# Patient Record
Sex: Male | Born: 1960 | Race: White | Hispanic: No | Marital: Married | Smoking: Never smoker
Health system: Southern US, Community
[De-identification: ages and names within clinical notes are randomized; demographics above are authoritative.]

## PROBLEM LIST (undated history)

## (undated) DIAGNOSIS — E782 Mixed hyperlipidemia: Secondary | ICD-10-CM

## (undated) DIAGNOSIS — N200 Calculus of kidney: Secondary | ICD-10-CM

## (undated) DIAGNOSIS — E119 Type 2 diabetes mellitus without complications: Secondary | ICD-10-CM

## (undated) HISTORY — DX: Calculus of kidney: N20.0

## (undated) HISTORY — DX: Mixed hyperlipidemia: E78.2

## (undated) HISTORY — PX: HERNIA REPAIR: SHX51

## (undated) HISTORY — DX: Type 2 diabetes mellitus without complications: E11.9

---

## 2019-06-13 ENCOUNTER — Encounter (HOSPITAL_COMMUNITY): Payer: Self-pay | Admitting: Emergency Medicine

## 2019-06-13 ENCOUNTER — Emergency Department (HOSPITAL_COMMUNITY): Payer: 59

## 2019-06-13 ENCOUNTER — Emergency Department (HOSPITAL_COMMUNITY)
Admission: EM | Admit: 2019-06-13 | Discharge: 2019-06-14 | Disposition: A | Discharge status: Home / Self Care | Payer: 59 | Attending: Emergency Medicine | Admitting: Emergency Medicine

## 2019-06-13 ENCOUNTER — Other Ambulatory Visit: Payer: Self-pay

## 2019-06-13 DIAGNOSIS — R197 Diarrhea, unspecified: Secondary | ICD-10-CM | POA: Insufficient documentation

## 2019-06-13 DIAGNOSIS — R509 Fever, unspecified: Secondary | ICD-10-CM | POA: Insufficient documentation

## 2019-06-13 DIAGNOSIS — R1110 Vomiting, unspecified: Secondary | ICD-10-CM | POA: Insufficient documentation

## 2019-06-13 DIAGNOSIS — R0602 Shortness of breath: Secondary | ICD-10-CM | POA: Diagnosis present

## 2019-06-13 DIAGNOSIS — R5383 Other fatigue: Secondary | ICD-10-CM | POA: Insufficient documentation

## 2019-06-13 DIAGNOSIS — Z5321 Procedure and treatment not carried out due to patient leaving prior to being seen by health care provider: Secondary | ICD-10-CM | POA: Insufficient documentation

## 2019-06-13 DIAGNOSIS — U071 COVID-19: Secondary | ICD-10-CM | POA: Diagnosis not present

## 2019-06-13 HISTORY — DX: Type 2 diabetes mellitus without complications: E11.9

## 2019-06-13 LAB — CBC WITH DIFFERENTIAL/PLATELET
Abs Immature Granulocytes: 0.04 10*3/uL (ref 0.00–0.07)
Basophils Absolute: 0 10*3/uL (ref 0.0–0.1)
Basophils Relative: 0 %
Eosinophils Absolute: 0 10*3/uL (ref 0.0–0.5)
Eosinophils Relative: 0 %
HCT: 49.4 % (ref 39.0–52.0)
Hemoglobin: 17 g/dL (ref 13.0–17.0)
Immature Granulocytes: 1 %
Lymphocytes Relative: 12 %
Lymphs Abs: 1 10*3/uL (ref 0.7–4.0)
MCH: 30.5 pg (ref 26.0–34.0)
MCHC: 34.4 g/dL (ref 30.0–36.0)
MCV: 88.7 fL (ref 80.0–100.0)
Monocytes Absolute: 0.6 10*3/uL (ref 0.1–1.0)
Monocytes Relative: 8 %
Neutro Abs: 6.6 10*3/uL (ref 1.7–7.7)
Neutrophils Relative %: 79 %
Platelets: 192 10*3/uL (ref 150–400)
RBC: 5.57 MIL/uL (ref 4.22–5.81)
RDW: 12.3 % (ref 11.5–15.5)
WBC: 8.3 10*3/uL (ref 4.0–10.5)
nRBC: 0 % (ref 0.0–0.2)

## 2019-06-13 LAB — COMPREHENSIVE METABOLIC PANEL
ALT: 24 U/L (ref 0–44)
AST: 26 U/L (ref 15–41)
Albumin: 3.1 g/dL — ABNORMAL LOW (ref 3.5–5.0)
Alkaline Phosphatase: 59 U/L (ref 38–126)
Anion gap: 14 (ref 5–15)
BUN: 9 mg/dL (ref 6–20)
CO2: 20 mmol/L — ABNORMAL LOW (ref 22–32)
Calcium: 8.4 mg/dL — ABNORMAL LOW (ref 8.9–10.3)
Chloride: 94 mmol/L — ABNORMAL LOW (ref 98–111)
Creatinine, Ser: 0.91 mg/dL (ref 0.61–1.24)
GFR calc Af Amer: >60 mL/min (ref >60)
GFR calc non Af Amer: >60 mL/min (ref >60)
Glucose, Bld: 222 mg/dL — ABNORMAL HIGH (ref 70–99)
Potassium: 3.2 mmol/L — ABNORMAL LOW (ref 3.5–5.1)
Sodium: 128 mmol/L — ABNORMAL LOW (ref 135–145)
Total Bilirubin: 1 mg/dL (ref 0.3–1.2)
Total Protein: 6.5 g/dL (ref 6.5–8.1)

## 2019-06-13 LAB — LACTIC ACID, PLASMA: Lactic Acid, Venous: 1.7 mmol/L (ref 0.5–1.9)

## 2019-06-13 MED ORDER — ACETAMINOPHEN 325 MG PO TABS
650.0000 mg | ORAL_TABLET | Freq: Once | ORAL | Status: AC
  Administered 2019-06-13: 650 mg via ORAL
  Filled 2019-06-13: qty 2

## 2019-06-13 NOTE — ED Triage Notes (Signed)
Patient states tested positive for Covid today at While [facility]. Symptoms started Sunday had fatigue, emesis, diarrhea and fever.

## 2019-06-14 NOTE — ED Notes (Signed)
Pt decided to leave without being seen. Understands risks

## 2019-06-14 NOTE — ED Notes (Signed)
Pt going to wait in the car. Wife's [phone].

## 2019-06-14 NOTE — ED Notes (Signed)
This tech attempted to reassess vital signs, pt refused

## 2021-04-08 IMAGING — DX DG CHEST 1V PORT
1 series · 2 of 2 positions shown · non-contrast
Comparison: 02/21/2012

CLINICAL DATA: Short of breath and hypoxia. COVID positive test
within the past week.

EXAM:
PORTABLE CHEST 1 VIEW

[Series 1: chest · 0.14mm/px · 2 of 2 slices shown]
[im 1/2]
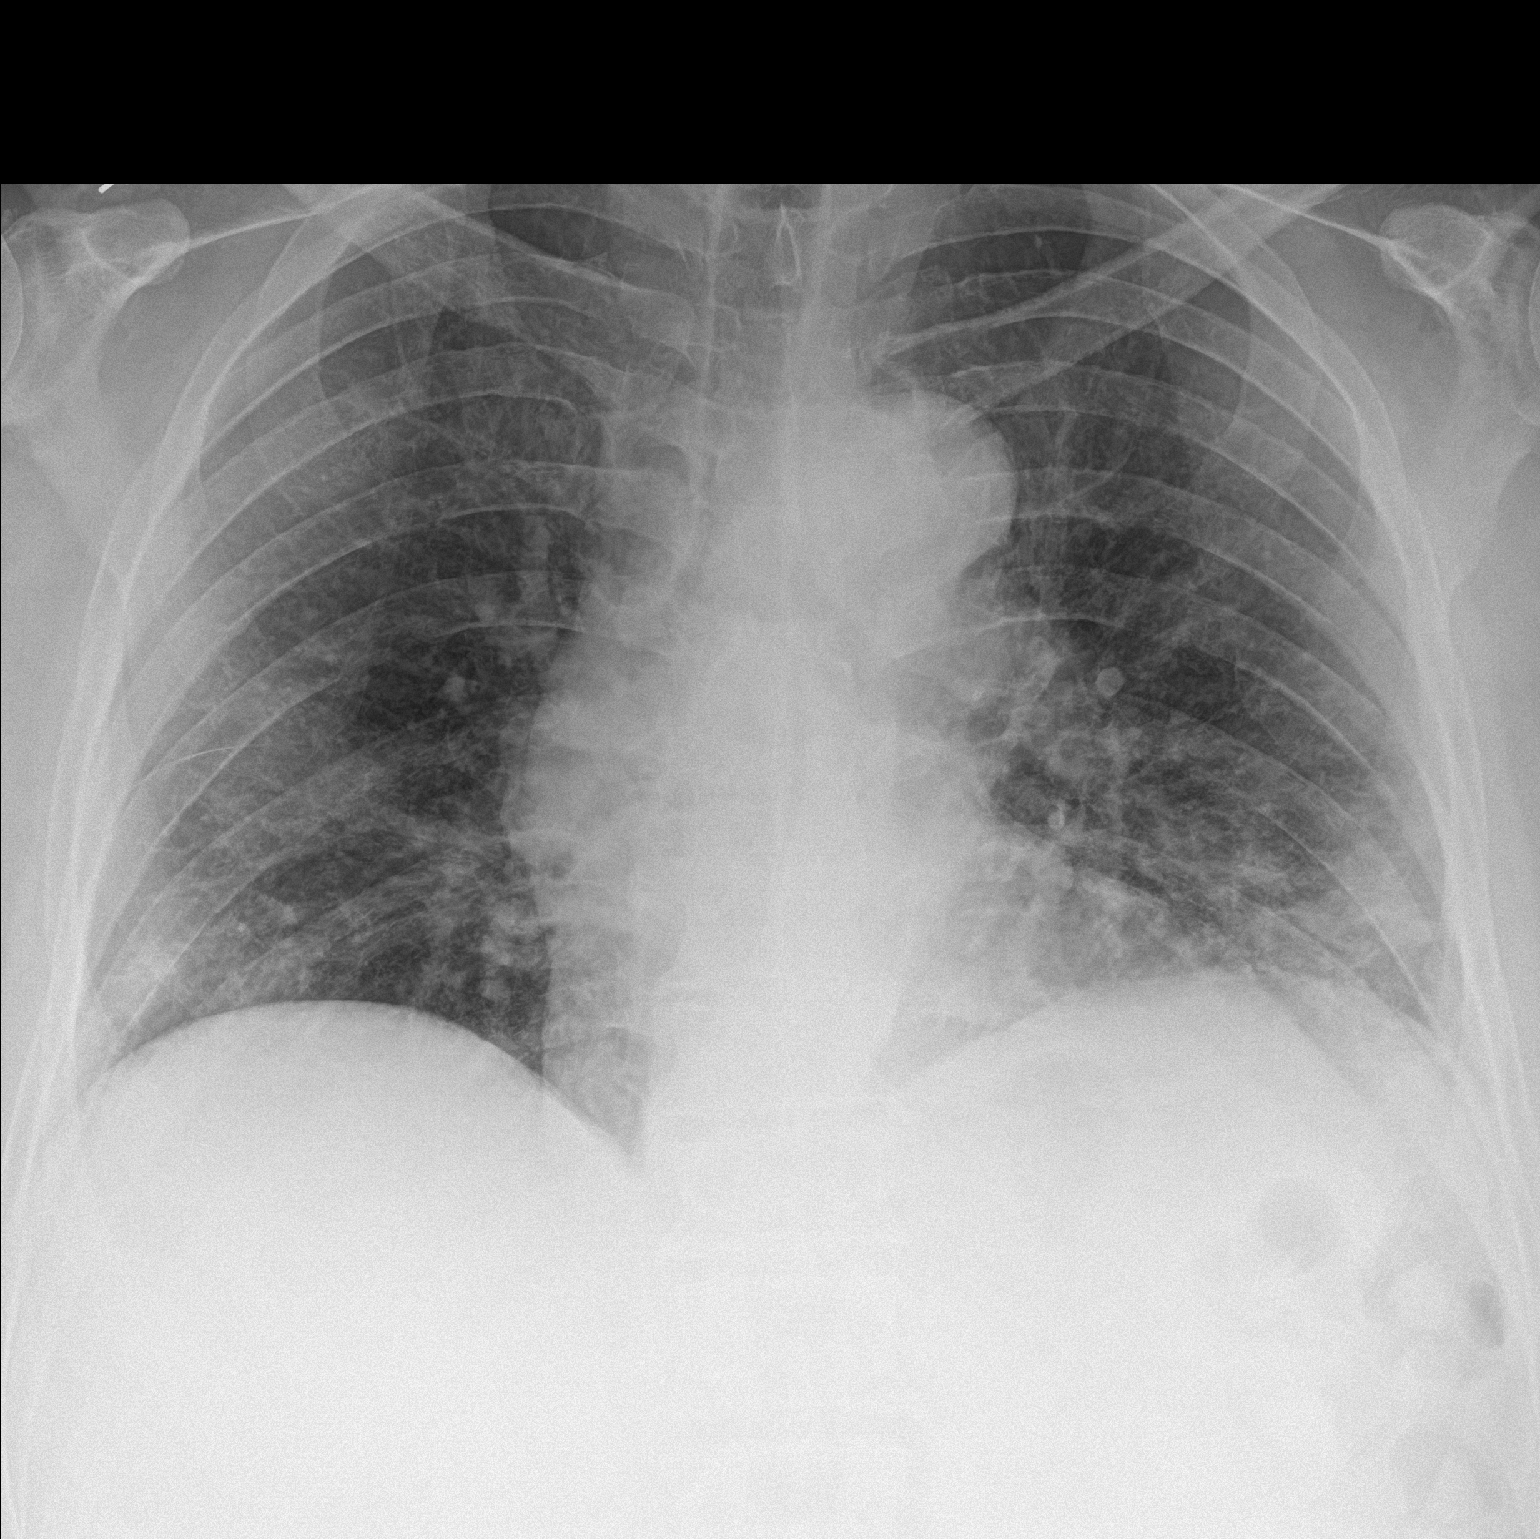
[im 2/2]
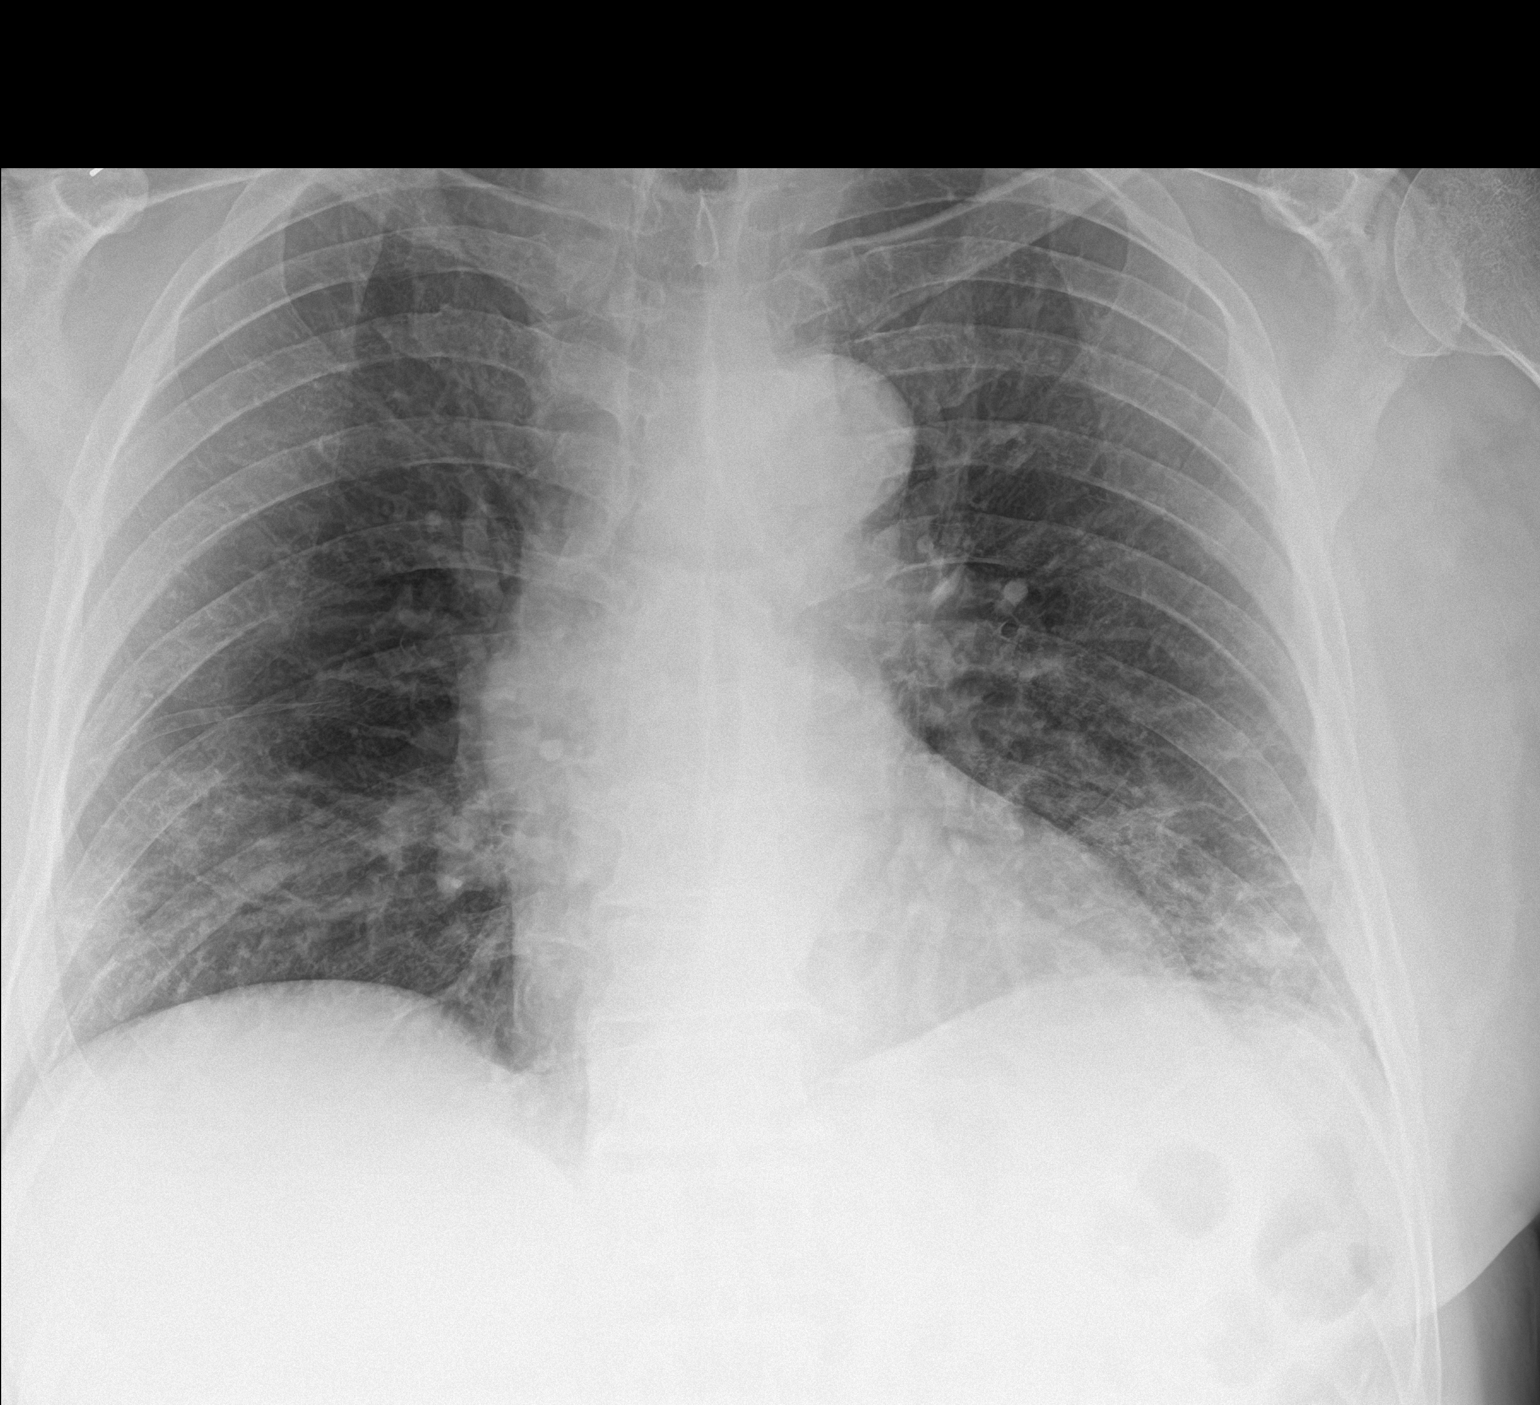

[2 of 2 positions shown; findings below may reference images not displayed]

FINDINGS: Heart size appears normal. Tortuous aorta. Bilateral lower lung zone
predominant interstitial and airspace opacities are identified.
IMPRESSION: 1. Bilateral lower lobe interstitial and airspace opacities
compatible with multifocal infection

## 2023-10-27 ENCOUNTER — Encounter: Payer: Self-pay | Admitting: Cardiology

## 2023-10-27 DIAGNOSIS — E782 Mixed hyperlipidemia: Secondary | ICD-10-CM | POA: Insufficient documentation

## 2023-10-27 DIAGNOSIS — E119 Type 2 diabetes mellitus without complications: Secondary | ICD-10-CM | POA: Insufficient documentation

## 2023-10-27 DIAGNOSIS — E785 Hyperlipidemia, unspecified: Secondary | ICD-10-CM

## 2023-11-02 ENCOUNTER — Ambulatory Visit: Admitting: Cardiology
